# Patient Record
Sex: Male | Born: 1990 | Race: White | Hispanic: No | Marital: Single | Smoking: Never smoker
Health system: Southern US, Community
[De-identification: ages and names within clinical notes are randomized; demographics above are authoritative.]

## PROBLEM LIST (undated history)

## (undated) DIAGNOSIS — F419 Anxiety disorder, unspecified: Secondary | ICD-10-CM

## (undated) DIAGNOSIS — F840 Autistic disorder: Secondary | ICD-10-CM

---

## 2017-05-04 ENCOUNTER — Emergency Department (HOSPITAL_BASED_OUTPATIENT_CLINIC_OR_DEPARTMENT_OTHER): Payer: Self-pay

## 2017-05-04 ENCOUNTER — Other Ambulatory Visit: Payer: Self-pay

## 2017-05-04 ENCOUNTER — Encounter (HOSPITAL_BASED_OUTPATIENT_CLINIC_OR_DEPARTMENT_OTHER): Payer: Self-pay | Admitting: Emergency Medicine

## 2017-05-04 ENCOUNTER — Emergency Department (HOSPITAL_BASED_OUTPATIENT_CLINIC_OR_DEPARTMENT_OTHER)
Admission: EM | Admit: 2017-05-04 | Discharge: 2017-05-04 | Disposition: A | Discharge status: Home / Self Care | Payer: Self-pay | Attending: Emergency Medicine | Admitting: Emergency Medicine

## 2017-05-04 DIAGNOSIS — F840 Autistic disorder: Secondary | ICD-10-CM | POA: Insufficient documentation

## 2017-05-04 DIAGNOSIS — B9789 Other viral agents as the cause of diseases classified elsewhere: Secondary | ICD-10-CM | POA: Insufficient documentation

## 2017-05-04 DIAGNOSIS — J069 Acute upper respiratory infection, unspecified: Secondary | ICD-10-CM | POA: Insufficient documentation

## 2017-05-04 HISTORY — DX: Anxiety disorder, unspecified: F41.9

## 2017-05-04 HISTORY — DX: Autistic disorder: F84.0

## 2017-05-04 NOTE — ED Provider Notes (Signed)
[facility] EMERGENCY DEPARTMENT Provider Note   CSN: [id number] Arrival date & time: 05/04/17  0606     History   Chief Complaint Chief Complaint  Patient presents with  . URI    HPI Scott Munoz is a 27 y.o. male.  HPI Patient is a healthy 27 year old male who presents the emergency department with complaints of acute onset shortness of breath when waking up from a bad dream.  He now is feeling much better and believes this may have represented a panic attack/anxiety.  He denies palpitations this morning.  No shortness of breath at this time.  No history of asthma.  He has had recent URI symptoms over the past 4-5 days but several family members have had similar symptoms.  He denies shortness of breath with exertion and shortness of breath at rest at this time.  Symptoms were mild to moderate in severity.  Currently asymptomatic.   Past Medical History:  Diagnosis Date  . Anxiety   . Autism spectrum disorder     There are no active problems to display for this patient.   History reviewed. No pertinent surgical history.     Home Medications    Prior to Admission medications   Not on File    Family History No family history on file.  Social History Social History   Tobacco Use  . Smoking status: Never Smoker  . Smokeless tobacco: Never Used  Substance Use Topics  . Alcohol use: No    Frequency: Never  . Drug use: No     Allergies   Patient has no known allergies.   Review of Systems Review of Systems  All other systems reviewed and are negative.    Physical Exam Updated Vital Signs BP 102/69 (BP Location: Left Arm)   Pulse 63   Temp 97.8 F (36.6 C) (Oral)   Resp 18   Ht 6\' 2"  (1.88 m)   Wt 63.5 kg (140 lb)   SpO2 100%   BMI 17.97 kg/m   Physical Exam  Constitutional: He is oriented to person, place, and time. He appears well-developed and well-nourished.  HENT:  Head: Normocephalic and atraumatic.  Eyes: EOM are  normal.  Neck: Normal range of motion.  Cardiovascular: Normal rate, regular rhythm and normal heart sounds.  Pulmonary/Chest: Effort normal and breath sounds normal. No respiratory distress.  Abdominal: He exhibits no distension.  Musculoskeletal: Normal range of motion.  Neurological: He is alert and oriented to person, place, and time.  Skin: Skin is warm and dry.  Psychiatric: He has a normal mood and affect. Judgment normal.  Nursing note and vitals reviewed.    ED Treatments / Results  Labs (all labs ordered are listed, but only abnormal results are displayed) Labs Reviewed - No data to display  EKG  EKG Interpretation None       Radiology Dg Chest 2 View  Result Date: 05/04/2017 CLINICAL DATA:  27 year old male with cough. EXAM: CHEST  2 VIEW COMPARISON:  None. FINDINGS: The heart size and mediastinal contours are within normal limits. Both lungs are clear. The visualized skeletal structures are unremarkable. IMPRESSION: No active cardiopulmonary disease. Electronically Signed   By: Elgie CollardArash  Radparvar M.D.   On: 05/04/2017 06:41    Procedures Procedures (including critical care time)  Medications Ordered in ED Medications - No data to display   Initial Impression / Assessment and Plan / ED Course  I have reviewed the triage vital signs and the nursing notes.  Pertinent labs & imaging results that were available during my care of the patient were reviewed by me and considered in my medical decision making (see chart for details).     Suspect panic attack.  Examination without abnormality.  Chest x-ray clear.  Vital signs stable.  No hypoxia or tachycardia.  Discharged home in good condition.  Primary care follow-up.  Final Clinical Impressions(s) / ED Diagnoses   Final diagnoses:  Viral URI with cough    ED Discharge Orders    None       Azalia Bilis, MD 05/04/17 [phone]

## 2017-05-04 NOTE — ED Notes (Signed)
Patient transported to X-ray 

## 2017-05-04 NOTE — ED Notes (Signed)
Pt reports waking up with anxiety due to his dream and had difficulty breathing, tense muscles and was lightheaded. Pt had a respiratory infection last week and concerned due to labored breathing. Pt reports feeling better on assessment. Pt speaking in complete sentences during assessment.

## 2017-05-04 NOTE — ED Triage Notes (Signed)
Pt has had cough, congestion and body aches x 3 days and awoke this morning with shob.

## 2019-01-02 IMAGING — DX DG CHEST 2V
3 series · 3 of 3 positions shown · non-contrast
Comparison: None.

CLINICAL DATA: 26-year-old male with cough.

EXAM:
CHEST  2 VIEW

[chest pa]
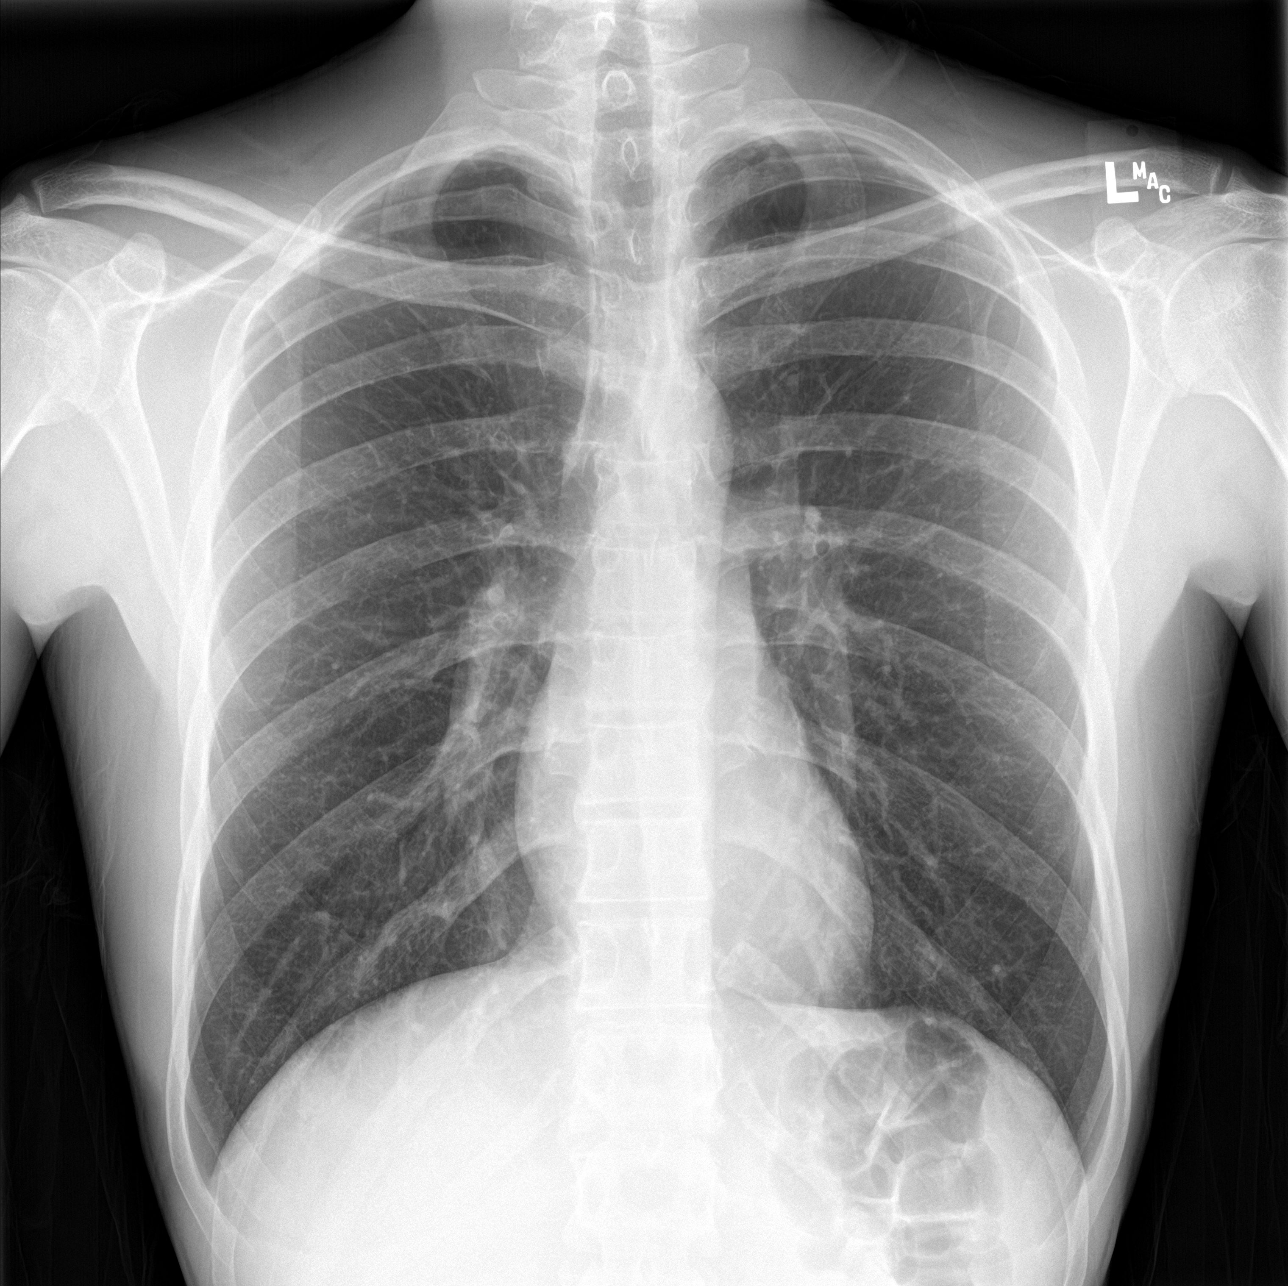

[chest lat (1 of 2)]
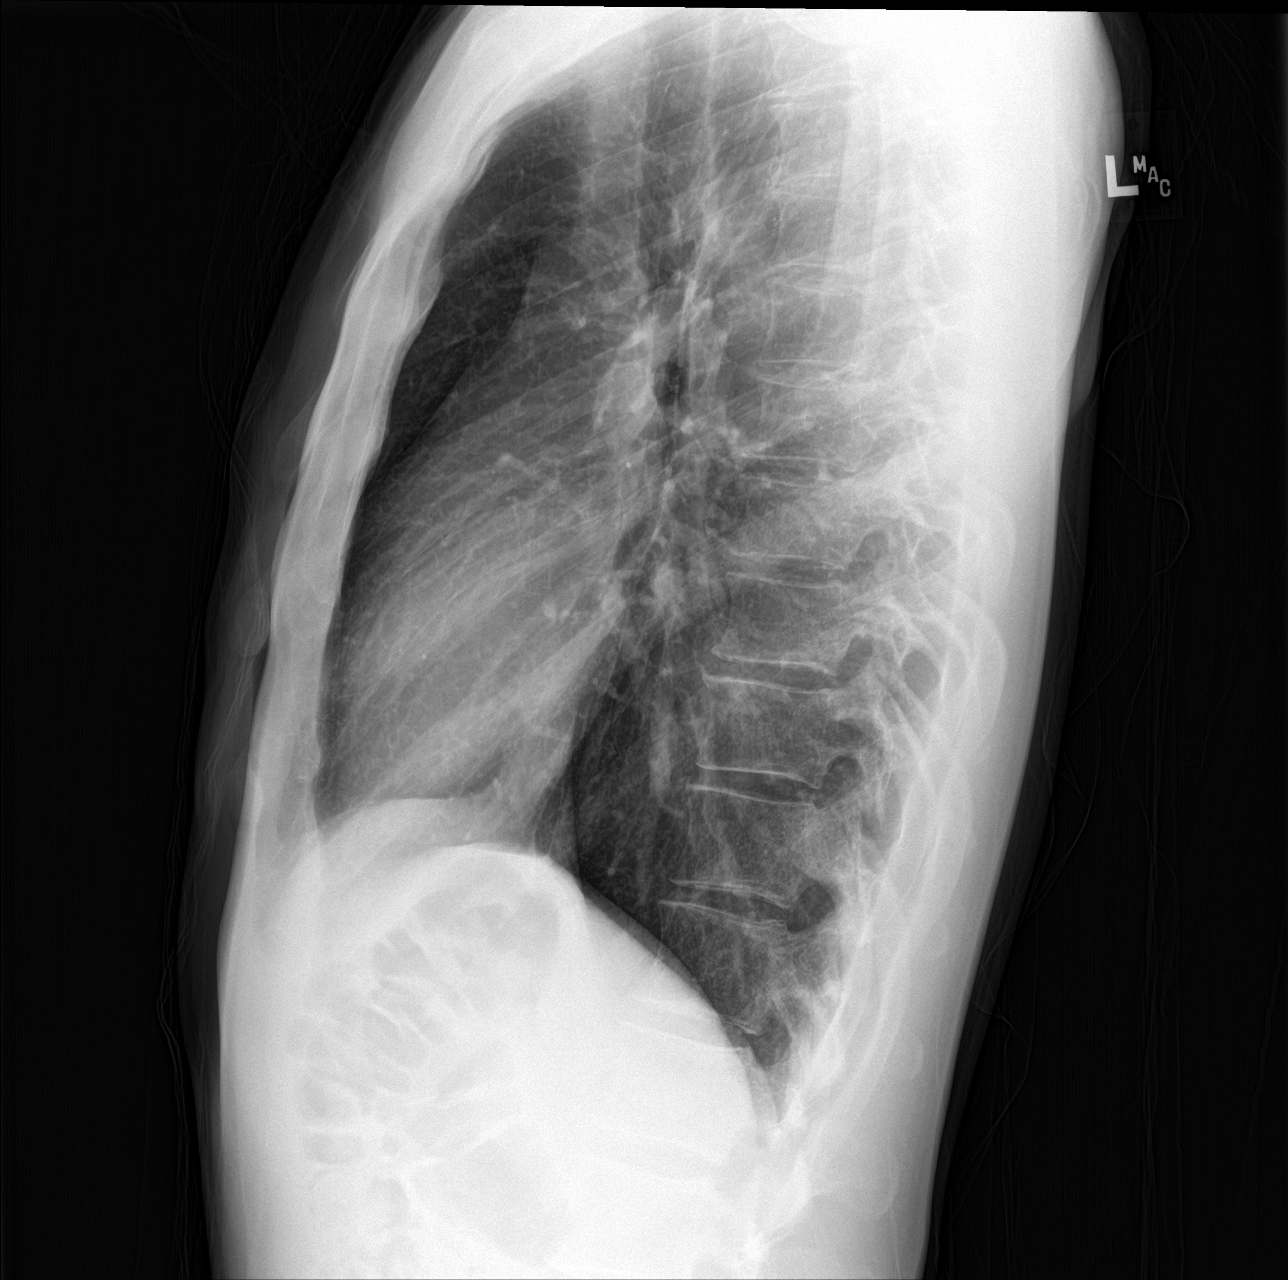

[chest lat (2 of 2)]
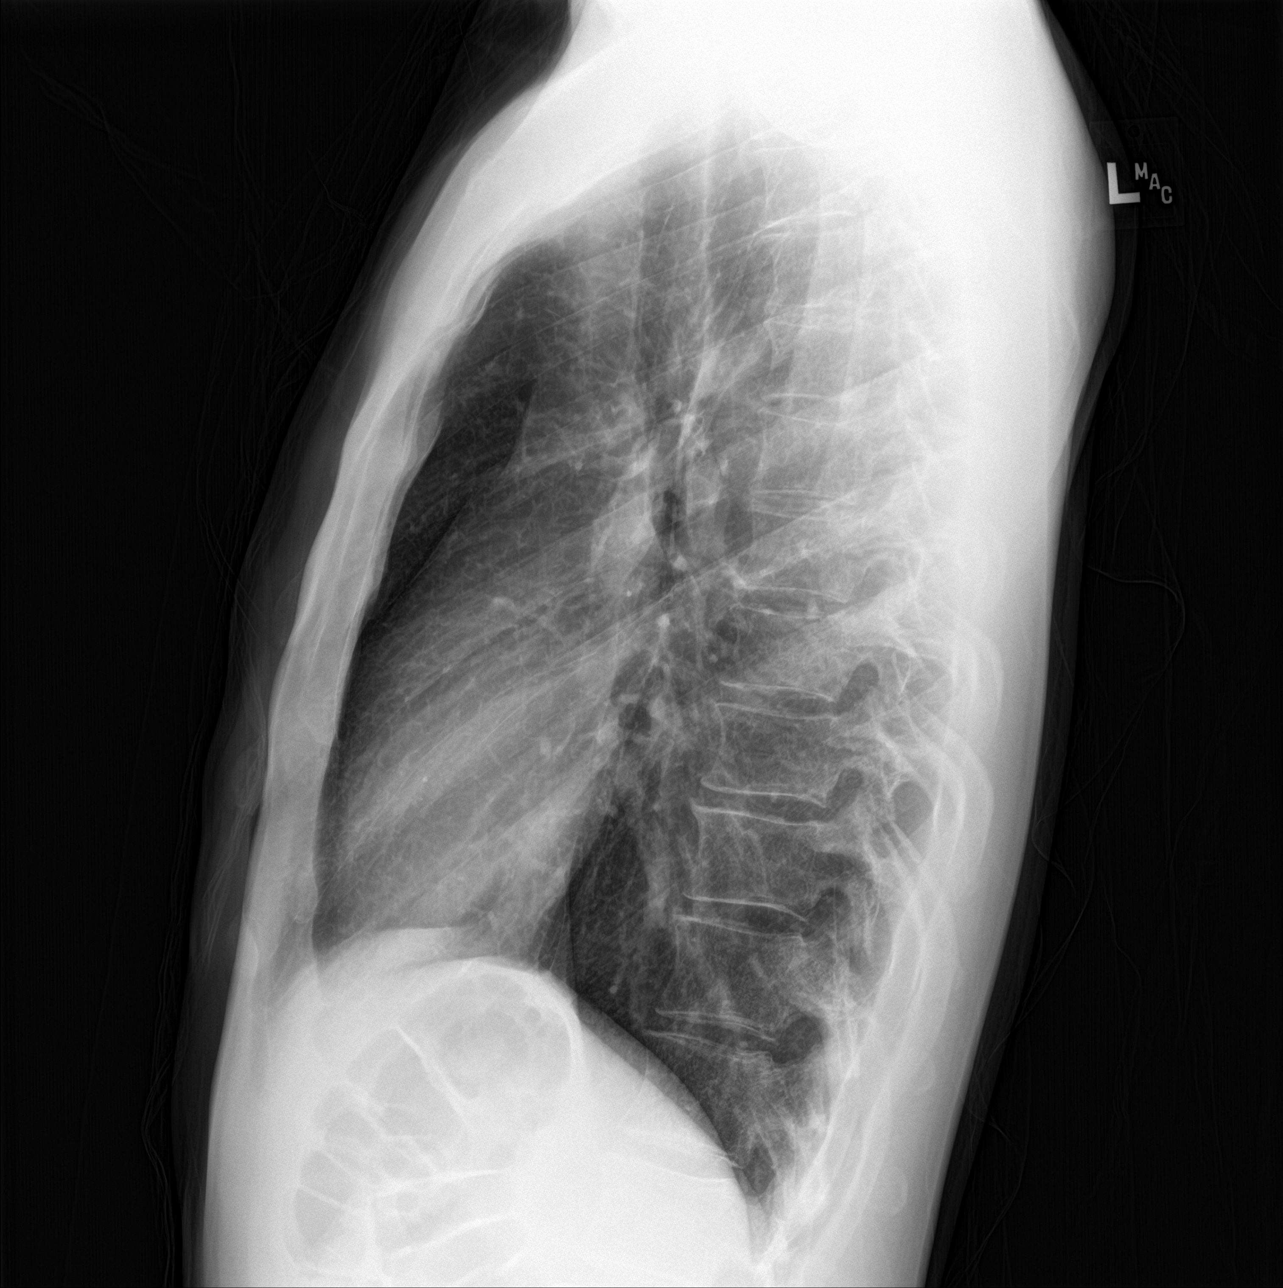

[3 of 3 positions shown; findings below may reference images not displayed]

FINDINGS: The heart size and mediastinal contours are within normal limits.
Both lungs are clear. The visualized skeletal structures are
unremarkable.
IMPRESSION: No active cardiopulmonary disease.

## 2021-08-20 ENCOUNTER — Other Ambulatory Visit: Payer: Self-pay

## 2021-08-20 ENCOUNTER — Encounter (HOSPITAL_COMMUNITY)
Admission: EM | Disposition: A | Discharge status: Home / Self Care | Payer: Self-pay | Source: Home / Self Care | Attending: Emergency Medicine

## 2021-08-20 ENCOUNTER — Emergency Department (HOSPITAL_BASED_OUTPATIENT_CLINIC_OR_DEPARTMENT_OTHER): Payer: Self-pay | Admitting: Anesthesiology

## 2021-08-20 ENCOUNTER — Emergency Department (HOSPITAL_BASED_OUTPATIENT_CLINIC_OR_DEPARTMENT_OTHER): Payer: Self-pay

## 2021-08-20 ENCOUNTER — Encounter (HOSPITAL_BASED_OUTPATIENT_CLINIC_OR_DEPARTMENT_OTHER): Payer: Self-pay | Admitting: Emergency Medicine

## 2021-08-20 ENCOUNTER — Observation Stay (HOSPITAL_BASED_OUTPATIENT_CLINIC_OR_DEPARTMENT_OTHER)
Admission: EM | Admit: 2021-08-20 | Discharge: 2021-08-21 | Disposition: A | Discharge status: Home / Self Care | Payer: Self-pay | Attending: General Surgery | Admitting: General Surgery

## 2021-08-20 ENCOUNTER — Emergency Department (HOSPITAL_COMMUNITY): Payer: Self-pay | Admitting: Anesthesiology

## 2021-08-20 DIAGNOSIS — F840 Autistic disorder: Secondary | ICD-10-CM | POA: Insufficient documentation

## 2021-08-20 DIAGNOSIS — K3580 Unspecified acute appendicitis: Secondary | ICD-10-CM

## 2021-08-20 DIAGNOSIS — K353 Acute appendicitis with localized peritonitis, without perforation or gangrene: Principal | ICD-10-CM | POA: Insufficient documentation

## 2021-08-20 HISTORY — PX: LAPAROSCOPIC APPENDECTOMY: SHX408

## 2021-08-20 LAB — CBC WITH DIFFERENTIAL/PLATELET
Abs Immature Granulocytes: 0.07 10*3/uL (ref 0.00–0.07)
Basophils Absolute: 0.1 10*3/uL (ref 0.0–0.1)
Basophils Relative: 0 %
Eosinophils Absolute: 0 10*3/uL (ref 0.0–0.5)
Eosinophils Relative: 0 %
HCT: 43.2 % (ref 39.0–52.0)
Hemoglobin: 15.2 g/dL (ref 13.0–17.0)
Immature Granulocytes: 0 %
Lymphocytes Relative: 9 %
Lymphs Abs: 1.4 10*3/uL (ref 0.7–4.0)
MCH: 31.1 pg (ref 26.0–34.0)
MCHC: 35.2 g/dL (ref 30.0–36.0)
MCV: 88.3 fL (ref 80.0–100.0)
Monocytes Absolute: 0.8 10*3/uL (ref 0.1–1.0)
Monocytes Relative: 5 %
Neutro Abs: 14 10*3/uL — ABNORMAL HIGH (ref 1.7–7.7)
Neutrophils Relative %: 86 %
Platelets: 257 10*3/uL (ref 150–400)
RBC: 4.89 MIL/uL (ref 4.22–5.81)
RDW: 12.3 % (ref 11.5–15.5)
WBC: 16.4 10*3/uL — ABNORMAL HIGH (ref 4.0–10.5)
nRBC: 0 % (ref 0.0–0.2)

## 2021-08-20 LAB — COMPREHENSIVE METABOLIC PANEL
ALT: 32 U/L (ref 0–44)
AST: 42 U/L — ABNORMAL HIGH (ref 15–41)
Albumin: 4.8 g/dL (ref 3.5–5.0)
Alkaline Phosphatase: 44 U/L (ref 38–126)
Anion gap: 17 — ABNORMAL HIGH (ref 5–15)
BUN: 18 mg/dL (ref 6–20)
CO2: 19 mmol/L — ABNORMAL LOW (ref 22–32)
Calcium: 9.6 mg/dL (ref 8.9–10.3)
Chloride: 103 mmol/L (ref 98–111)
Creatinine, Ser: 0.94 mg/dL (ref 0.61–1.24)
GFR, Estimated: >60 mL/min (ref >60)
Glucose, Bld: 119 mg/dL — ABNORMAL HIGH (ref 70–99)
Potassium: 3.5 mmol/L (ref 3.5–5.1)
Sodium: 139 mmol/L (ref 135–145)
Total Bilirubin: 1.2 mg/dL (ref 0.3–1.2)
Total Protein: 8.2 g/dL — ABNORMAL HIGH (ref 6.5–8.1)

## 2021-08-20 LAB — URINALYSIS, ROUTINE W REFLEX MICROSCOPIC
Bilirubin Urine: NEGATIVE
Glucose, UA: NEGATIVE mg/dL
Hgb urine dipstick: NEGATIVE
Ketones, ur: >=80 mg/dL — AB
Leukocytes,Ua: NEGATIVE
Nitrite: NEGATIVE
Protein, ur: NEGATIVE mg/dL
Specific Gravity, Urine: 1.015 (ref 1.005–1.030)
pH: 7.5 (ref 5.0–8.0)

## 2021-08-20 LAB — LIPASE, BLOOD: Lipase: 31 U/L (ref 11–51)

## 2021-08-20 SURGERY — APPENDECTOMY, LAPAROSCOPIC
Anesthesia: General | Site: Abdomen

## 2021-08-20 MED ORDER — SUCCINYLCHOLINE CHLORIDE 200 MG/10ML IV SOSY
PREFILLED_SYRINGE | INTRAVENOUS | Status: DC | PRN
  Administered 2021-08-20: 100 mg via INTRAVENOUS

## 2021-08-20 MED ORDER — ROCURONIUM BROMIDE 10 MG/ML (PF) SYRINGE
PREFILLED_SYRINGE | INTRAVENOUS | Status: DC | PRN
  Administered 2021-08-20: 40 mg via INTRAVENOUS

## 2021-08-20 MED ORDER — ONDANSETRON HCL 4 MG/2ML IJ SOLN
4.0000 mg | Freq: Once | INTRAMUSCULAR | Status: AC
  Administered 2021-08-20: 4 mg via INTRAVENOUS
  Filled 2021-08-20: qty 2

## 2021-08-20 MED ORDER — BUPIVACAINE-EPINEPHRINE 0.5% -1:200000 IJ SOLN
INTRAMUSCULAR | Status: DC | PRN
  Administered 2021-08-20: 15 mL

## 2021-08-20 MED ORDER — MIDAZOLAM HCL 2 MG/2ML IJ SOLN
INTRAMUSCULAR | Status: DC | PRN
  Administered 2021-08-20: 2 mg via INTRAVENOUS

## 2021-08-20 MED ORDER — SODIUM CHLORIDE 0.9 % IV SOLN: INTRAVENOUS | Status: DC | PRN

## 2021-08-20 MED ORDER — DEXAMETHASONE SODIUM PHOSPHATE 10 MG/ML IJ SOLN
INTRAMUSCULAR | Status: DC | PRN
  Administered 2021-08-20: 5 mg via INTRAVENOUS

## 2021-08-20 MED ORDER — ACETAMINOPHEN 500 MG PO TABS: 1000.0000 mg | ORAL_TABLET | Freq: Once | ORAL | Status: DC

## 2021-08-20 MED ORDER — PHENOL 1.4 % MT LIQD
1.0000 | OROMUCOSAL | Status: DC | PRN
  Filled 2021-08-20: qty 177

## 2021-08-20 MED ORDER — SENNOSIDES-DOCUSATE SODIUM 8.6-50 MG PO TABS
1.0000 | ORAL_TABLET | Freq: Every day | ORAL | Status: DC
  Administered 2021-08-20: 1 via ORAL
  Filled 2021-08-20: qty 1

## 2021-08-20 MED ORDER — ONDANSETRON 4 MG PO TBDP
4.0000 mg | ORAL_TABLET | Freq: Four times a day (QID) | ORAL | Status: DC | PRN
Start: 2021-08-20 — End: 2021-08-21
  Filled 2021-08-20: qty 1

## 2021-08-20 MED ORDER — METRONIDAZOLE 500 MG/100ML IV SOLN
500.0000 mg | Freq: Once | INTRAVENOUS | Status: DC
  Filled 2021-08-20: qty 100

## 2021-08-20 MED ORDER — OXYCODONE HCL 5 MG PO TABS: 5.0000 mg | ORAL_TABLET | Freq: Once | ORAL | Status: DC | PRN

## 2021-08-20 MED ORDER — KCL IN DEXTROSE-NACL 20-5-0.45 MEQ/L-%-% IV SOLN
INTRAVENOUS | Status: DC
  Filled 2021-08-20: qty 1000

## 2021-08-20 MED ORDER — BISACODYL 10 MG RE SUPP
10.0000 mg | Freq: Every day | RECTAL | Status: DC | PRN
Start: 2021-08-20 — End: 2021-08-21

## 2021-08-20 MED ORDER — SODIUM CHLORIDE 0.9 % IV SOLN
2.0000 g | Freq: Once | INTRAVENOUS | Status: AC
  Administered 2021-08-20: 2 g via INTRAVENOUS
  Filled 2021-08-20: qty 20

## 2021-08-20 MED ORDER — IOHEXOL 300 MG/ML  SOLN
100.0000 mL | Freq: Once | INTRAMUSCULAR | Status: AC | PRN
  Administered 2021-08-20: 100 mL via INTRAVENOUS

## 2021-08-20 MED ORDER — DROPERIDOL 2.5 MG/ML IJ SOLN: 0.6250 mg | Freq: Once | INTRAMUSCULAR | Status: DC | PRN

## 2021-08-20 MED ORDER — 0.9 % SODIUM CHLORIDE (POUR BTL) OPTIME
TOPICAL | Status: DC | PRN
  Administered 2021-08-20: 1000 mL

## 2021-08-20 MED ORDER — PROPOFOL 10 MG/ML IV BOLUS
INTRAVENOUS | Status: DC | PRN
  Administered 2021-08-20: 50 mg via INTRAVENOUS
  Administered 2021-08-20: 150 mg via INTRAVENOUS
  Administered 2021-08-20: 20 mg via INTRAVENOUS

## 2021-08-20 MED ORDER — LIDOCAINE HCL (CARDIAC) PF 100 MG/5ML IV SOSY
PREFILLED_SYRINGE | INTRAVENOUS | Status: DC | PRN
  Administered 2021-08-20: 80 mg via INTRAVENOUS

## 2021-08-20 MED ORDER — FENTANYL CITRATE PF 50 MCG/ML IJ SOSY: 25.0000 ug | PREFILLED_SYRINGE | INTRAMUSCULAR | Status: DC | PRN

## 2021-08-20 MED ORDER — ACETAMINOPHEN 10 MG/ML IV SOLN
INTRAVENOUS | Status: AC
  Filled 2021-08-20: qty 100

## 2021-08-20 MED ORDER — ACETAMINOPHEN 10 MG/ML IV SOLN
INTRAVENOUS | Status: DC | PRN
Start: 2021-08-20 — End: 2021-08-20
  Administered 2021-08-20: 1000 mg via INTRAVENOUS

## 2021-08-20 MED ORDER — ONDANSETRON HCL 4 MG/2ML IJ SOLN
INTRAMUSCULAR | Status: DC | PRN
  Administered 2021-08-20: 4 mg via INTRAVENOUS

## 2021-08-20 MED ORDER — LACTATED RINGERS IV BOLUS
1000.0000 mL | Freq: Once | INTRAVENOUS | Status: AC
  Administered 2021-08-20: 1000 mL via INTRAVENOUS

## 2021-08-20 MED ORDER — HYDROCODONE-ACETAMINOPHEN 5-325 MG PO TABS
1.0000 | ORAL_TABLET | ORAL | Status: DC | PRN
  Filled 2021-08-20 (×2): qty 1

## 2021-08-20 MED ORDER — FENTANYL CITRATE (PF) 250 MCG/5ML IJ SOLN
INTRAMUSCULAR | Status: DC | PRN
  Administered 2021-08-20: 100 ug via INTRAVENOUS
  Administered 2021-08-20 (×3): 50 ug via INTRAVENOUS

## 2021-08-20 MED ORDER — SIMETHICONE 80 MG PO CHEW: 40.0000 mg | CHEWABLE_TABLET | Freq: Four times a day (QID) | ORAL | Status: DC | PRN

## 2021-08-20 MED ORDER — LACTATED RINGERS IV SOLN: INTRAVENOUS | Status: DC | PRN

## 2021-08-20 MED ORDER — MORPHINE SULFATE (PF) 2 MG/ML IV SOLN
2.0000 mg | INTRAVENOUS | Status: DC | PRN
  Administered 2021-08-20 (×2): 2 mg via INTRAVENOUS
  Filled 2021-08-20 (×2): qty 1

## 2021-08-20 MED ORDER — OXYCODONE HCL 5 MG/5ML PO SOLN: 5.0000 mg | Freq: Once | ORAL | Status: DC | PRN

## 2021-08-20 MED ORDER — SUGAMMADEX SODIUM 200 MG/2ML IV SOLN
INTRAVENOUS | Status: DC | PRN
  Administered 2021-08-20: 150 mg via INTRAVENOUS

## 2021-08-20 MED ORDER — ENOXAPARIN SODIUM 40 MG/0.4ML IJ SOSY
40.0000 mg | PREFILLED_SYRINGE | INTRAMUSCULAR | Status: DC
  Filled 2021-08-20: qty 0.4

## 2021-08-20 MED ORDER — DICYCLOMINE HCL 10 MG PO CAPS
10.0000 mg | ORAL_CAPSULE | Freq: Once | ORAL | Status: AC
Start: 2021-08-20 — End: 2021-08-20
  Administered 2021-08-20: 10 mg via ORAL
  Filled 2021-08-20: qty 1

## 2021-08-20 MED ORDER — LACTATED RINGERS IR SOLN
Status: DC | PRN
  Administered 2021-08-20: 1000 mL

## 2021-08-20 MED ORDER — ONDANSETRON HCL 4 MG/2ML IJ SOLN: 4.0000 mg | Freq: Four times a day (QID) | INTRAMUSCULAR | Status: DC | PRN

## 2021-08-20 SURGICAL SUPPLY — 45 items
APPLIER CLIP 5 13 M/L LIGAMAX5 (MISCELLANEOUS) ×2
APPLIER CLIP ROT 10 11.4 M/L (STAPLE)
BAG COUNTER SPONGE SURGICOUNT (BAG) IMPLANT
BAG RETRIEVAL 10 (BASKET) ×1
CABLE HIGH FREQUENCY MONO STRZ (ELECTRODE) ×2 IMPLANT
CHLORAPREP W/TINT 26 (MISCELLANEOUS) ×2 IMPLANT
CLIP APPLIE 5 13 M/L LIGAMAX5 (MISCELLANEOUS) IMPLANT
CLIP APPLIE ROT 10 11.4 M/L (STAPLE) IMPLANT
DERMABOND ADVANCED (GAUZE/BANDAGES/DRESSINGS) ×1
DERMABOND ADVANCED .7 DNX12 (GAUZE/BANDAGES/DRESSINGS) ×1 IMPLANT
DRAPE LAPAROSCOPIC ABDOMINAL (DRAPES) IMPLANT
ELECT REM PT RETURN 15FT ADLT (MISCELLANEOUS) ×2 IMPLANT
GLOVE BIO SURGEON STRL SZ 6.5 (GLOVE) ×2 IMPLANT
GLOVE BIOGEL PI IND STRL 7.0 (GLOVE) ×1 IMPLANT
GLOVE BIOGEL PI INDICATOR 7.0 (GLOVE) ×1
GLOVE INDICATOR 6.5 STRL GRN (GLOVE) ×2 IMPLANT
GOWN STRL REUS W/ TWL XL LVL3 (GOWN DISPOSABLE) ×2 IMPLANT
GOWN STRL REUS W/TWL XL LVL3 (GOWN DISPOSABLE) ×2
GRASPER SUT TROCAR 14GX15 (MISCELLANEOUS) IMPLANT
HANDLE STAPLE EGIA 4 XL (STAPLE) ×1 IMPLANT
IRRIG SUCT STRYKERFLOW 2 WTIP (MISCELLANEOUS) ×2
IRRIGATION SUCT STRKRFLW 2 WTP (MISCELLANEOUS) ×1 IMPLANT
KIT BASIN OR (CUSTOM PROCEDURE TRAY) ×2 IMPLANT
KIT TURNOVER KIT A (KITS) IMPLANT
MARKER SKIN DUAL TIP RULER LAB (MISCELLANEOUS) IMPLANT
PENCIL SMOKE EVACUATOR (MISCELLANEOUS) IMPLANT
RELOAD EGIA 45 MED/THCK PURPLE (STAPLE) IMPLANT
RELOAD EGIA 45 TAN VASC (STAPLE) IMPLANT
RELOAD EGIA 60 MED/THCK PURPLE (STAPLE) ×2 IMPLANT
RELOAD EGIA 60 TAN VASC (STAPLE) ×1 IMPLANT
RELOAD STAPLE 60 MED/THCK ART (STAPLE) IMPLANT
SCISSORS LAP 5X35 DISP (ENDOMECHANICALS) ×1 IMPLANT
SLEEVE XCEL OPT CAN 5 100 (ENDOMECHANICALS) ×2 IMPLANT
SPIKE FLUID TRANSFER (MISCELLANEOUS) ×2 IMPLANT
SUT VIC AB 2-0 SH 27 (SUTURE)
SUT VIC AB 2-0 SH 27X BRD (SUTURE) IMPLANT
SUT VIC AB 4-0 PS2 27 (SUTURE) ×2 IMPLANT
SUT VICRYL 0 UR6 27IN ABS (SUTURE) ×1 IMPLANT
SYS BAG RETRIEVAL 10MM (BASKET) ×1
SYSTEM BAG RETRIEVAL 10MM (BASKET) IMPLANT
TOWEL OR 17X26 10 PK STRL BLUE (TOWEL DISPOSABLE) ×2 IMPLANT
TRAY FOLEY MTR SLVR 16FR STAT (SET/KITS/TRAYS/PACK) ×1 IMPLANT
TRAY LAPAROSCOPIC (CUSTOM PROCEDURE TRAY) ×2 IMPLANT
TROCAR BLADELESS OPT 5 100 (ENDOMECHANICALS) ×1 IMPLANT
TROCAR XCEL BLUNT TIP 100MML (ENDOMECHANICALS) ×2 IMPLANT

## 2021-08-20 NOTE — ED Notes (Signed)
Spoke to Lurena Joiner, Charity fundraiser at [employer] , aware about patient's transfer directly to PACU for surgical prep.  ?

## 2021-08-20 NOTE — Transfer of Care (Signed)
Immediate Anesthesia Transfer of Care Note ? ?Patient: Scott Munoz ? ?Procedure(s) Performed: APPENDECTOMY LAPAROSCOPIC (Abdomen) ? ?Patient Location: PACU ? ?Anesthesia Type:General ? ?Level of Consciousness: awake, drowsy and patient cooperative ? ?Airway & Oxygen Therapy: Patient Spontanous Breathing ? ?Post-op Assessment: Report given to RN and Post -op Vital signs reviewed and stable ? ?Post vital signs: Reviewed and stable ? ?Last Vitals:  ?Vitals Value Taken Time  ?BP 123/85 08/20/21 1515  ?Temp    ?Pulse 95 08/20/21 1517  ?Resp 17 08/20/21 1518  ?SpO2 100 % 08/20/21 1517  ?Vitals shown include unvalidated device data. ? ?Last Pain:  ?Vitals:  ? 08/20/21 1225  ?TempSrc: Oral  ?PainSc:   ?   ? ?  ? ?Complications: No notable events documented. ?

## 2021-08-20 NOTE — ED Triage Notes (Signed)
Pt arrives pov, slow gait, with c/o lower and RLQ pain that started in the middle of the night.  ?

## 2021-08-20 NOTE — Op Note (Signed)
Lc Joynt ?[id number] ? ? ?PRE-OPERATIVE DIAGNOSIS:  ACUTE APPENDICITIS ? ?POST-OPERATIVE DIAGNOSIS:  ACUTE APPENDICITIS ? ? Procedure(s): ?APPENDECTOMY LAPAROSCOPIC ? ?  Surgeon(s): ?Romie Levee, MD ? ?ASSISTANT: none  ? ?ANESTHESIA:   local and general ? ?EBL:   20 ml ? ?Delay start of Pharmacological VTE agent (>24hrs) due to surgical blood loss or risk of bleeding:  no ? ?DRAINS: none  ? ?SPECIMEN:  Source of Specimen:  appendix ? ?DISPOSITION OF SPECIMEN:  PATHOLOGY ? ?COUNTS:  YES ? ?PLAN OF CARE: Admit for overnight observation ? ?PATIENT DISPOSITION:  PACU - hemodynamically stable. ? ? ?INDICATIONS: ?Patient with concerning symptoms & work up suspicious for appendicitis.  Surgery was recommended: ? ?The anatomy & physiology of the digestive tract was discussed.  The pathophysiology of appendicitis was discussed.  Natural history risks without surgery was discussed.   I feel the risks of no intervention will lead to serious problems that outweigh the operative risks; therefore, I recommended diagnostic laparoscopy with removal of appendix to remove the pathology.  Laparoscopic & open techniques were discussed.   I noted a good likelihood this will help address the problem.   ? ?Risks such as bleeding, infection, abscess, leak, reoperation, possible ostomy, hernia, heart attack, death, and other risks were discussed.  Goals of post-operative recovery were discussed as well.  We will work to minimize complications.  Questions were answered.  The patient expresses understanding & wishes to proceed with surgery. ? ?OR FINDINGS: acute appendicitis  ? ?DESCRIPTION:  ? ?The patient was identified & brought into the operating room. The patient was positioned supine with left arm tucked. SCDs were active during the entire case. The patient underwent general anesthesia without any difficulty.  A foley catheter was inserted under sterile conditions. The abdomen was prepped and draped in a sterile fashion. A  Surgical Timeout confirmed our plan. ? ? I made a vertical incision through the inferior umbilical fold.  I made a nick in the infraumbilical fascia and confirmed peritoneal entry.  I placed a stay suture and then the [facility] port.  We induced carbon dioxide insufflation.  Camera inspection revealed no injury.  I placed additional ports under direct laparoscopic visualization. ? ?I mobilized the terminal ileum to proximal ascending colon in a lateral to medial fashion.  I took care to avoid injuring any retroperitoneal structures.   I freed the appendix off its attachments to the ascending colon and cecal mesentery.  I elevated the appendix.  I was able to free off the base of the appendix, which was still viable.  I stapled the appendix off the cecum using a laparoscopic blue load stapler.  I took a healthy cuff viable cecum. I skeletonized & ligated the mesoappendix with a white load stapler.   I placed the appendix inside an EndoCatch bag and removed out the [place] port. ? ?I did copious irrigation. Hemostasis was good in the mesoappendix, colon mesentery, and retroperitoneum. Staple line was intact on the cecum with no bleeding. I washed out the pelvis, retrohepatic space and right paracolic gutter.  Hemostasis is good. There was no perforation or injury.  Because the area cleaned up well after irrigation, I did not place a drain. ? ?I aspirated the carbon dioxide. I removed the ports. I closed the umbilical fascia site using a 0 Vicryl stitch. I closed skin using 4-0 vicryl stitch.  Sterile dressings were applied. ? ?Patient was extubated and sent to the recovery room.  I discussed the operative findings with  the patient's family. I suspect the patient is going used in the hospital at least overnight and will need antibiotics for 0 more days. Questions answered. They expressed understanding and appreciation.  ? ?Vanita Panda, MD ? ?Colorectal and General Surgery ?[facility]  ?

## 2021-08-20 NOTE — ED Notes (Signed)
Report called to Mia , RN at OR .  ?

## 2021-08-20 NOTE — Anesthesia Preprocedure Evaluation (Addendum)
Anesthesia Evaluation  ?Patient identified by MRN, date of birth, ID band ?Patient awake ? ? ? ?Reviewed: ?Allergy & Precautions, NPO status , Patient's Chart, lab work & pertinent test results ? ?History of Anesthesia Complications ?Negative for: history of anesthetic complications ? ?Airway ?Mallampati: I ? ?TM Distance: >3 FB ?Neck ROM: Full ? ? ? Dental ?no notable dental hx. ? ?  ?Pulmonary ?neg pulmonary ROS,  ?  ?Pulmonary exam normal ? ? ? ? ? ? ? Cardiovascular ?negative cardio ROS ?Normal cardiovascular exam ? ? ?  ?Neuro/Psych ?Anxiety ASD  ? GI/Hepatic ?negative GI ROS, Neg liver ROS, Acute appendicitis ?  ?Endo/Other  ?negative endocrine ROS ? Renal/GU ?negative Renal ROS  ?negative genitourinary ?  ?Musculoskeletal ?negative musculoskeletal ROS ?(+)  ? Abdominal ?  ?Peds ? Hematology ?negative hematology ROS ?(+)   ?Anesthesia Other Findings ?Day of surgery medications reviewed with patient. ? Reproductive/Obstetrics ?negative OB ROS ? ?  ? ? ? ? ? ? ? ? ? ? ? ? ? ?  ?  ? ? ? ? ? ? ? ?Anesthesia Physical ?Anesthesia Plan ? ?ASA: 1 and emergent ? ?Anesthesia Plan: General  ? ?Post-op Pain Management: Tylenol PO (pre-op)*  ? ?Induction: Intravenous and Rapid sequence ? ?PONV Risk Score and Plan: 3 and Treatment may vary due to age or medical condition, Ondansetron, Dexamethasone and Midazolam ? ?Airway Management Planned: Oral ETT ? ?Additional Equipment: None ? ?Intra-op Plan:  ? ?Post-operative Plan: Extubation in OR ? ?Informed Consent: I have reviewed the patients History and Physical, chart, labs and discussed the procedure including the risks, benefits and alternatives for the proposed anesthesia with the patient or authorized representative who has indicated his/her understanding and acceptance.  ? ? ? ?Dental advisory given ? ?Plan Discussed with: CRNA ? ?Anesthesia Plan Comments:   ? ? ? ? ? ?Anesthesia Quick Evaluation ? ?

## 2021-08-20 NOTE — ED Notes (Signed)
Pt refused Flagyl administration; sts he does not want 2 antibiotics going at the same time ?

## 2021-08-20 NOTE — Anesthesia Procedure Notes (Signed)
Procedure Name: Intubation ?Date/Time: 08/20/2021 2:23 PM ?Performed by: Raenette Rover, CRNA ?Pre-anesthesia Checklist: Patient identified, Emergency Drugs available, Suction available and Patient being monitored ?Patient Re-evaluated:Patient Re-evaluated prior to induction ?Oxygen Delivery Method: Circle system utilized ?Preoxygenation: Pre-oxygenation with 100% oxygen ?Induction Type: IV induction ?Ventilation: Mask ventilation without difficulty ?Laryngoscope Size: Mac and 4 ?Grade View: Grade I ?Tube type: Oral ?Tube size: 7.5 mm ?Number of attempts: 1 ?Airway Equipment and Method: Stylet ?Placement Confirmation: ETT inserted through vocal cords under direct vision, positive ETCO2 and breath sounds checked- equal and bilateral ?Secured at: 22 cm ?Tube secured with: Tape ?Dental Injury: Teeth and Oropharynx as per pre-operative assessment  ? ? ? ? ?

## 2021-08-20 NOTE — H&P (Signed)
? ? ?CC: RLQ pain ? ? ? ?HPI: ?Scott Munoz is an 30 y.o. male who is here for RLQ pain, nausea and vomiting.  This began last night.  He was seen in [facility].  CT showed acute appendicitis  ? ?Past Medical History:  ?Diagnosis Date  ? Anxiety   ? Autism spectrum disorder   ? ? ?History reviewed. No pertinent surgical history. ? ?History reviewed. No pertinent family history. ? ?Social:  reports that he has never smoked. He has never used smokeless tobacco. He reports that he does not drink alcohol and does not use drugs. ? ?Allergies: No Known Allergies ? ?Medications: I have reviewed the patient's current medications. ? ?Results for orders placed or performed during the hospital encounter of 08/20/21 (from the past 48 hour(s))  ?Lipase, blood     Status: None  ? Collection Time: 08/20/21  9:30 AM  ?Result Value Ref Range  ? Lipase 31 11 - 51 U/L  ?  Comment: Performed at [facility], [address] [place], [place] [address]  ?CBC with Differential     Status: Abnormal  ? Collection Time: 08/20/21  9:30 AM  ?Result Value Ref Range  ? WBC 16.4 (H) 4.0 - 10.5 K/uL  ? RBC 4.89 4.22 - 5.81 MIL/uL  ? Hemoglobin 15.2 13.0 - 17.0 g/dL  ? HCT 43.2 39.0 - 52.0 %  ? MCV 88.3 80.0 - 100.0 fL  ? MCH 31.1 26.0 - 34.0 pg  ? MCHC 35.2 30.0 - 36.0 g/dL  ? RDW 12.3 11.5 - 15.5 %  ? Platelets 257 150 - 400 K/uL  ? nRBC 0.0 0.0 - 0.2 %  ? Neutrophils Relative % 86 %  ? Neutro Abs 14.0 (H) 1.7 - 7.7 K/uL  ? Lymphocytes Relative 9 %  ? Lymphs Abs 1.4 0.7 - 4.0 K/uL  ? Monocytes Relative 5 %  ? Monocytes Absolute 0.8 0.1 - 1.0 K/uL  ? Eosinophils Relative 0 %  ? Eosinophils Absolute 0.0 0.0 - 0.5 K/uL  ? Basophils Relative 0 %  ? Basophils Absolute 0.1 0.0 - 0.1 K/uL  ? Immature Granulocytes 0 %  ? Abs Immature Granulocytes 0.07 0.00 - 0.07 K/uL  ?  Comment: Performed at [facility], [address] [place], [place] [address]  ?Comprehensive metabolic panel     Status: Abnormal  ? Collection Time: 08/20/21  9:30  AM  ?Result Value Ref Range  ? Sodium 139 135 - 145 mmol/L  ? Potassium 3.5 3.5 - 5.1 mmol/L  ? Chloride 103 98 - 111 mmol/L  ? CO2 19 (L) 22 - 32 mmol/L  ? Glucose, Bld 119 (H) 70 - 99 mg/dL  ?  Comment: Glucose reference range applies only to samples taken after fasting for at least 8 hours.  ? BUN 18 6 - 20 mg/dL  ? Creatinine, Ser 0.94 0.61 - 1.24 mg/dL  ? Calcium 9.6 8.9 - 10.3 mg/dL  ? Total Protein 8.2 (H) 6.5 - 8.1 g/dL  ? Albumin 4.8 3.5 - 5.0 g/dL  ? AST 42 (H) 15 - 41 U/L  ? ALT 32 0 - 44 U/L  ? Alkaline Phosphatase 44 38 - 126 U/L  ? Total Bilirubin 1.2 0.3 - 1.2 mg/dL  ? GFR, Estimated >60 >60 mL/min  ?  Comment: (NOTE) ?Calculated using the CKD-EPI Creatinine Equation (2021) ?  ? Anion gap 17 (H) 5 - 15  ?  Comment: Performed at [facility]  [address] [place], [place] [address]  ?Urinalysis, Routine w reflex microscopic     Status: Abnormal  ? Collection Time: 08/20/21 10:46 AM  ?Result Value Ref Range  ? Color, Urine YELLOW YELLOW  ? APPearance CLEAR CLEAR  ? Specific Gravity, Urine 1.015 1.005 - 1.030  ? pH 7.5 5.0 - 8.0  ? Glucose, UA NEGATIVE NEGATIVE mg/dL  ? Hgb urine dipstick NEGATIVE NEGATIVE  ? Bilirubin Urine NEGATIVE NEGATIVE  ? Ketones, ur >=80 (A) NEGATIVE mg/dL  ? Protein, ur NEGATIVE NEGATIVE mg/dL  ? Nitrite NEGATIVE NEGATIVE  ? Leukocytes,Ua NEGATIVE NEGATIVE  ?  Comment: Microscopic not done on urines with negative protein, blood, leukocytes, nitrite, or glucose < 500 mg/dL. ?Performed at [facility], [address] [place], [place] [address] ?  ? ? ?CT ABDOMEN PELVIS W CONTRAST ? ?Result Date: 08/20/2021 ?CLINICAL DATA:  Acute onset right lower quadrant pain last night. EXAM: CT ABDOMEN AND PELVIS WITH CONTRAST TECHNIQUE: Multidetector CT imaging of the abdomen and pelvis was performed using the standard protocol following bolus administration of intravenous contrast. RADIATION DOSE REDUCTION: This exam was performed according to the departmental  dose-optimization program which includes automated exposure control, adjustment of the mA and/or kV according to patient size and/or use of iterative reconstruction technique. CONTRAST:  139mL OMNIPAQUE IOHEXOL 300 MG/ML  SOLN COMPARISON:  None Available. FINDINGS: Lower Chest: No acute findings. Hepatobiliary: No hepatic masses identified. Gallbladder is unremarkable. No evidence of biliary ductal dilatation. Pancreas:  No mass or inflammatory changes. Spleen: Within normal limits in size and appearance. Adrenals/Urinary Tract: No masses identified. No evidence of ureteral calculi or hydronephrosis. Stomach/Bowel: Acute appendicitis mild periappendiceal inflammatory changes seen, with following details: Appendix: Location- Standard Diameter-11 mm Appendicolith- Present Mucosal hyper-enhancement- Present Extraluminal Gas- Absent Periappendiceal Collection- None, but tiny amount of free fluid noted in pelvic cul-de-sac. Vascular/Lymphatic: No pathologically enlarged lymph nodes. No acute vascular findings. Reproductive:  No mass or other significant abnormality. Other:  None. Musculoskeletal:  No suspicious bone lesions identified. IMPRESSION: Positive for acute appendicitis. No evidence of abscess or other complication. Electronically Signed   By: Marlaine Hind M.D.   On: 08/20/2021 10:48   ? ?ROS - all of the below systems have been reviewed with the patient and positives are indicated with bold text ?General: chills, fever or night sweats ?Eyes: blurry vision or double vision ?ENT: epistaxis or sore throat ?Hematologic/Lymphatic: bleeding problems, blood clots or swollen lymph nodes ?Endocrine: temperature intolerance or unexpected weight changes ?Breast: new or changing breast lumps or nipple discharge ?Resp: cough, shortness of breath, or wheezing ?CV: chest pain or dyspnea on exertion ?GI: as per HPI ?GU: dysuria, trouble voiding, or hematuria ?Neuro: TIA or stroke symptoms ? ? ? ?PE ?Blood pressure 135/74,  pulse 67, temperature 98.9 ?F (37.2 ?C), temperature source Oral, resp. rate 20, height 6\' 2"  (1.88 m), weight 65.8 kg, SpO2 100 %. ?Constitutional: NAD; conversant; no deformities ?Eyes: Moist conjunctiva; no lid lag; anicteric; PERRL ?Neck: Trachea midline; no thyromegaly ?Lungs: Normal respiratory effort ?CV: RRR ?GI: Abd soft, TTP RLQ ?MSK: Normal range of motion of extremities; no clubbing/cyanosis ?Psychiatric: Appropriate affect; alert and oriented x3 ? ?Results for orders placed or performed during the hospital encounter of 08/20/21 (from the past 48 hour(s))  ?Lipase, blood     Status: None  ? Collection Time: 08/20/21  9:30 AM  ?Result Value Ref Range  ? Lipase 31 11 - 51 U/L  ?  Comment:  Performed at [facility], [address], [place], [place] [address]  ?CBC with Differential     Status: Abnormal  ? Collection Time: 08/20/21  9:30 AM  ?Result Value Ref Range  ? WBC 16.4 (H) 4.0 - 10.5 K/uL  ? RBC 4.89 4.22 - 5.81 MIL/uL  ? Hemoglobin 15.2 13.0 - 17.0 g/dL  ? HCT 43.2 39.0 - 52.0 %  ? MCV 88.3 80.0 - 100.0 fL  ? MCH 31.1 26.0 - 34.0 pg  ? MCHC 35.2 30.0 - 36.0 g/dL  ? RDW 12.3 11.5 - 15.5 %  ? Platelets 257 150 - 400 K/uL  ? nRBC 0.0 0.0 - 0.2 %  ? Neutrophils Relative % 86 %  ? Neutro Abs 14.0 (H) 1.7 - 7.7 K/uL  ? Lymphocytes Relative 9 %  ? Lymphs Abs 1.4 0.7 - 4.0 K/uL  ? Monocytes Relative 5 %  ? Monocytes Absolute 0.8 0.1 - 1.0 K/uL  ? Eosinophils Relative 0 %  ? Eosinophils Absolute 0.0 0.0 - 0.5 K/uL  ? Basophils Relative 0 %  ? Basophils Absolute 0.1 0.0 - 0.1 K/uL  ? Immature Granulocytes 0 %  ? Abs Immature Granulocytes 0.07 0.00 - 0.07 K/uL  ?  Comment: Performed at [facility], [address] [place], [place] [address]  ?Comprehensive metabolic panel     Status: Abnormal  ? Collection Time: 08/20/21  9:30 AM  ?Result Value Ref Range  ? Sodium 139 135 - 145 mmol/L  ? Potassium 3.5 3.5 - 5.1 mmol/L  ? Chloride 103 98 - 111 mmol/L  ? CO2 19 (L) 22 - 32 mmol/L  ?  Glucose, Bld 119 (H) 70 - 99 mg/dL  ?  Comment: Glucose reference range applies only to samples taken after fasting for at least 8 hours.  ? BUN 18 6 - 20 mg/dL  ? Creatinine, Ser 0.94 0.61 - 1.24 mg/dL  ? Calcium 9.6

## 2021-08-20 NOTE — ED Provider Notes (Addendum)
?[facility] EMERGENCY DEPARTMENT ?Provider Note ? ? ?CSN: [id number] ?Arrival date & time: 08/20/21  [id number] ? ?  ? ?History ?Chief Complaint  ?Patient presents with  ? Abdominal Pain  ? ? ?Chet Uchida is a 31 y.o. male history of anxiety and autism spectrum disorder presents to the emergency department for evaluation of periumbilical/right lower quadrant pain since 0100 today.  He denies any nausea, vomiting, diarrhea, constipation, dysuria, hematuria, fevers, chest pain, or shortness of breath.  Denies any testicular, penile, or scrotal swelling or pain.  Reports he has been belching.  Denies any medical or surgical history.  No daily medications.  No known drug allergies.  Denies any tobacco, EtOH, illicit drug use ever. ? ? ?Abdominal Pain ?Associated symptoms: no chest pain, no chills, no constipation, no diarrhea, no dysuria, no fever, no hematuria, no nausea, no shortness of breath and no vomiting   ? ?  ? ?Home Medications ?Prior to Admission medications   ?Not on File  ?   ? ?Allergies    ?Patient has no known allergies.   ? ?Review of Systems   ?Review of Systems  ?Constitutional:  Negative for chills and fever.  ?Respiratory:  Negative for shortness of breath.   ?Cardiovascular:  Negative for chest pain.  ?Gastrointestinal:  Positive for abdominal pain. Negative for blood in stool, constipation, diarrhea, nausea and vomiting.  ?Genitourinary:  Negative for dysuria, frequency, hematuria, penile discharge, penile pain, penile swelling, scrotal swelling and testicular pain.  ?Musculoskeletal:  Negative for back pain.  ? ?Physical Exam ?Updated Vital Signs ?BP 133/78   Pulse (!) 47   Temp 97.7 ?F (36.5 ?C)   Resp 20   Ht 6\' 2"  (1.88 m)   Wt 65.8 kg   SpO2 100%   BMI 18.62 kg/m?  ?Physical Exam ?Vitals and nursing note reviewed.  ?Constitutional:   ?   General: He is not in acute distress. ?   Appearance: Normal appearance. He is not ill-appearing or toxic-appearing.  ?HENT:  ?   Head:  Normocephalic and atraumatic.  ?   Mouth/Throat:  ?   Mouth: Mucous membranes are moist.  ?Eyes:  ?   General: No scleral icterus. ?Cardiovascular:  ?   Rate and Rhythm: Regular rhythm. Bradycardia present.  ?Pulmonary:  ?   Effort: Pulmonary effort is normal. No respiratory distress.  ?   Breath sounds: Normal breath sounds.  ?Abdominal:  ?   General: Abdomen is flat. Bowel sounds are normal.  ?   Palpations: Abdomen is soft.  ?   Tenderness: There is abdominal tenderness in the right lower quadrant and periumbilical area. There is no right CVA tenderness, left CVA tenderness, guarding or rebound. Negative signs include Rovsing's sign, psoas sign and obturator sign.  ?   Comments: Abdomen soft with normal active bowel sounds.  No overlying skin changes noted.  Tenderness to the periumbilical and right lower quadrant.  Negative Rovsing's, obturator, and psoas sign.  ?Musculoskeletal:     ?   General: No deformity.  ?   Cervical back: Normal range of motion.  ?Skin: ?   General: Skin is warm and dry.  ?Neurological:  ?   General: No focal deficit present.  ?   Mental Status: He is alert. Mental status is at baseline.  ? ? ?ED Results / Procedures / Treatments   ?Labs ?(all labs ordered are listed, but only abnormal results are displayed) ?Labs Reviewed  ?CBC WITH DIFFERENTIAL/PLATELET - Abnormal; Notable for the following  components:  ?    Result Value  ? WBC 16.4 (*)   ? Neutro Abs 14.0 (*)   ? All other components within normal limits  ?COMPREHENSIVE METABOLIC PANEL - Abnormal; Notable for the following components:  ? CO2 19 (*)   ? Glucose, Bld 119 (*)   ? Total Protein 8.2 (*)   ? AST 42 (*)   ? Anion gap 17 (*)   ? All other components within normal limits  ?URINALYSIS, ROUTINE W REFLEX MICROSCOPIC - Abnormal; Notable for the following components:  ? Ketones, ur >=80 (*)   ? All other components within normal limits  ?LIPASE, BLOOD  ? ? ?EKG ?None ? ?Radiology ?CT ABDOMEN PELVIS W CONTRAST ? ?Result Date:  08/20/2021 ?CLINICAL DATA:  Acute onset right lower quadrant pain last night. EXAM: CT ABDOMEN AND PELVIS WITH CONTRAST TECHNIQUE: Multidetector CT imaging of the abdomen and pelvis was performed using the standard protocol following bolus administration of intravenous contrast. RADIATION DOSE REDUCTION: This exam was performed according to the departmental dose-optimization program which includes automated exposure control, adjustment of the mA and/or kV according to patient size and/or use of iterative reconstruction technique. CONTRAST:  162mL OMNIPAQUE IOHEXOL 300 MG/ML  SOLN COMPARISON:  None Available. FINDINGS: Lower Chest: No acute findings. Hepatobiliary: No hepatic masses identified. Gallbladder is unremarkable. No evidence of biliary ductal dilatation. Pancreas:  No mass or inflammatory changes. Spleen: Within normal limits in size and appearance. Adrenals/Urinary Tract: No masses identified. No evidence of ureteral calculi or hydronephrosis. Stomach/Bowel: Acute appendicitis mild periappendiceal inflammatory changes seen, with following details: Appendix: Location- Standard Diameter-11 mm Appendicolith- Present Mucosal hyper-enhancement- Present Extraluminal Gas- Absent Periappendiceal Collection- None, but tiny amount of free fluid noted in pelvic cul-de-sac. Vascular/Lymphatic: No pathologically enlarged lymph nodes. No acute vascular findings. Reproductive:  No mass or other significant abnormality. Other:  None. Musculoskeletal:  No suspicious bone lesions identified. IMPRESSION: Positive for acute appendicitis. No evidence of abscess or other complication. Electronically Signed   By: Marlaine Hind M.D.   On: 08/20/2021 10:48   ? ?Procedures ?Procedures  ? ?Medications Ordered in ED ?Medications  ?cefTRIAXone (ROCEPHIN) 2 g in sodium chloride 0.9 % 100 mL IVPB (2 g Intravenous New Bag/Given 08/20/21 1144)  ?  And  ?metroNIDAZOLE (FLAGYL) IVPB 500 mg (has no administration in time range)  ?0.9 %  sodium  chloride infusion ( Intravenous New Bag/Given 08/20/21 1143)  ?ondansetron (ZOFRAN) injection 4 mg (4 mg Intravenous Given 08/20/21 1008)  ?lactated ringers bolus 1,000 mL ( Intravenous Stopped 08/20/21 1127)  ?dicyclomine (BENTYL) capsule 10 mg (10 mg Oral Given 08/20/21 1008)  ?iohexol (OMNIPAQUE) 300 MG/ML solution 100 mL (100 mLs Intravenous Contrast Given 08/20/21 1020)  ? ? ?ED Course/ Medical Decision Making/ A&P ?  ?                        ?Medical Decision Making ?Amount and/or Complexity of Data Reviewed ?Labs: ordered. ?Radiology: ordered. ? ?Risk ?Prescription drug management. ?Decision regarding hospitalization. ? ? ?31 year old male presents emergency department for evaluation of periumbilical and right lower quadrant pain since 0100 today.  On arrival, the patient had a vomiting episode that was not bloody and nonbilious.  His vital signs show bradycardia otherwise normal.  Normotensive, afebrile, normal respiratory rate satting well on room air without any increased work of breathing.  Physical exam is pertinent for Abdomen soft with normal active bowel sounds.  No overlying skin changes noted.  Mild tenderness to the periumbilical and right lower quadrant.  Negative Rovsing's, obturator, and psoas sign.  Patient is overall well-appearing. ? ?He declines any pain medication, but is willing for some Bentyl PO only.  Ordered Zofran and 1L of LR. ? ?Basic lab work ordered.  CBC shows significant leukocytosis at 16.4 with a left shift.  Normal lipase.  CMP shows decrease in CO2 at 19 with an elevated gap at 17.  Glucose elevated at 119.  Total protein elevated 8.2.  AST mildly elevated 42.  Urinalysis shows greater than 80 ketones, otherwise normal.  CT abdomen is positive for acute appendicitis without perforation. ? ?With the positive acute appendicitis read on CT, ED antibiotics were ordered for Rocephin and Flagyl. ? ?The patient initially had a respiratory rate of 24 and with his white count of 16 does meet  two criteria for SIRs. However, the patient is no acute appearing and his respiratory rate improved. He is not septic appearing.  ? ?Consult was placed to general surgery.  Dr. Marcello Moores spoke to my attending, Dr. Kathrynn Humble and s

## 2021-08-21 MED ORDER — ACETAMINOPHEN 325 MG PO TABS
650.0000 mg | ORAL_TABLET | Freq: Once | ORAL | Status: AC
  Administered 2021-08-21: 650 mg via ORAL
  Filled 2021-08-21: qty 2

## 2021-08-21 MED ORDER — TRAMADOL HCL 50 MG PO TABS
50.0000 mg | ORAL_TABLET | Freq: Four times a day (QID) | ORAL | 0 refills | Status: AC | PRN
Start: 2021-08-21 — End: ?

## 2021-08-21 NOTE — Discharge Summary (Signed)
Physician Discharge Summary  ?Patient ID: ?Scott Munoz ?MRN: [id number] ?DOB/AGE: 33-Nov-1992 33 y.o. ? ?Admit date: 08/20/2021 ?Discharge date: 08/21/2021 ? ?Admission Diagnoses: ?Acute appendicitis ? ?Discharge Diagnoses:  ?Principal Problem: ?  Acute appendicitis ? ? ?Discharged Condition: good ? ?Hospital Course: Patient presented to [facility] with acute appendicitis.  He was transferred to the operating room and laparoscopic appendectomy was performed.  He was monitored overnight and deemed to be in stable condition.  He was discharged home when tolerating a diet and pain controlled with oral medications. ? ?Consults: None ? ?Significant Diagnostic Studies: labs: cbc, bmet ? ?Treatments: IV hydration, antibiotics: ceftriaxone, and surgery: lap appendectomy ? ?Discharge Exam: ?Blood pressure (!) 121/55, pulse (!) 52, temperature 98.9 ?F (37.2 ?C), temperature source Oral, resp. rate 16, height 6\' 2"  (1.88 m), weight 65.8 kg, SpO2 99 %. ?General appearance: alert and cooperative ?GI: soft, non-distended ?Incision/Wound: clean, dry ? ?Disposition: home ? ? ?Allergies as of 08/21/2021   ?No Known Allergies ?  ? ?  ?Medication List  ?  ? ?TAKE these medications   ? ?traMADol 50 MG tablet ?Commonly known as: ULTRAM ?Take 1-2 tablets (50-100 mg total) by mouth every 6 (six) hours as needed. ?  ? ?  ? ? Follow-up Information   ? ? [facility], [place]. Schedule an appointment as soon as possible for a visit in 2 week(s).   ?Specialty: General Surgery ?Contact information: ?[address] ?[address] ?[place] ?[phone] ? ?  ?  ? ?  ?  ? ?  ? ? ?Signed: ?Rosario Adie ?08/21/2021, 8:26 AM ? ? ?

## 2021-08-21 NOTE — Progress Notes (Signed)
The patient is alert and oriented and has been seen by his physician. Patient ambulated around the unit three times this morning. Patient given Tylenol as requested prior to discharge. Patient tolerated regular diet well and ate 100% of breakfast. The orders for discharge were written. IV has been removed. Went over discharge instructions with patient and family. He is being walked down to his ride for discharge with all of his belongings.   ?

## 2021-08-21 NOTE — Plan of Care (Signed)

## 2021-08-21 NOTE — Clinical Social Work Note (Signed)
?  Transition of Care (TOC) Screening Note ? ? ?Patient Details  ?Name: Scott Munoz ?Date of Birth: 07-08-1990 ? ? ?Transition of Care (TOC) CM/SW Contact:    ?Darleene Cleaver, LCSW ?Phone Number: ?08/21/2021, 9:38 AM ? ? ? ?Transition of Care Department [facility]) has reviewed patient and no TOC needs have been identified at this time. We will continue to monitor patient advancement through interdisciplinary progression rounds. If new patient transition needs arise, please place a TOC consult. ?  ?

## 2021-08-22 ENCOUNTER — Encounter (HOSPITAL_COMMUNITY): Payer: Self-pay | Admitting: General Surgery

## 2021-08-22 NOTE — Anesthesia Postprocedure Evaluation (Signed)
Anesthesia Post Note ? ?Patient: Scott Munoz ? ?Procedure(s) Performed: APPENDECTOMY LAPAROSCOPIC (Abdomen) ? ?  ? ?Patient location during evaluation: PACU ?Anesthesia Type: General ?Level of consciousness: awake and alert ?Pain management: pain level controlled ?Vital Signs Assessment: post-procedure vital signs reviewed and stable ?Respiratory status: spontaneous breathing, nonlabored ventilation and respiratory function stable ?Cardiovascular status: blood pressure returned to baseline ?Postop Assessment: no apparent nausea or vomiting ?Anesthetic complications: no ? ? ?No notable events documented. ? ? ?Shanda Howells ? ? ? ? ?

## 2021-08-23 LAB — SURGICAL PATHOLOGY

## 2023-04-20 IMAGING — CT CT ABD-PELV W/ CM
2 of 4 series · 16 of 46 positions shown, 18 images · IV contrast (Omnipaque)
Comparison: None Available.

CLINICAL DATA: Acute onset right lower quadrant pain last night.

EXAM:
CT ABDOMEN AND PELVIS WITH CONTRAST
TECHNIQUE: Multidetector CT imaging of the abdomen and pelvis was performed
using the standard protocol following bolus administration of
intravenous contrast.

[Series 2: axial st · axial · 0.82mm/px · z∈[-533,-113]mm · 13 of 94 slices shown, 15 images]
[im 5/94  soft-tissue]
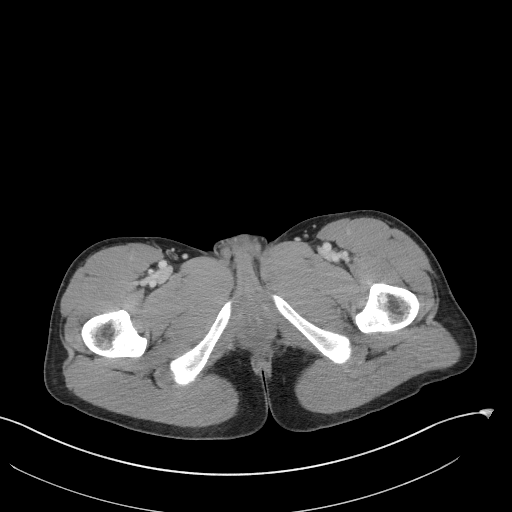
[im 5/94  bone]
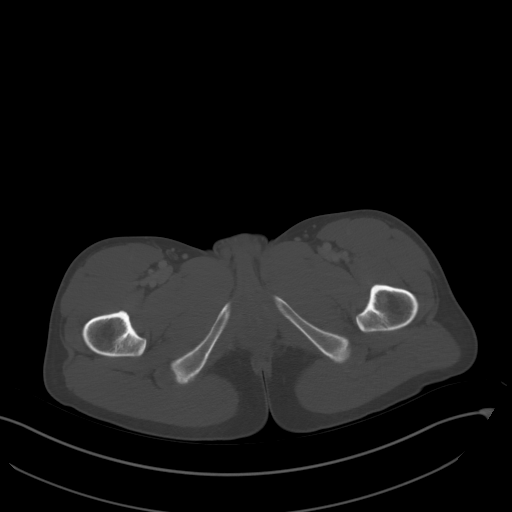
[im 13/94  soft-tissue]
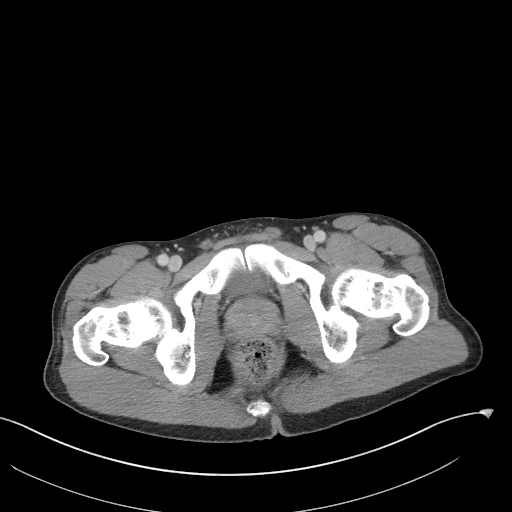
[im 21/94  soft-tissue]
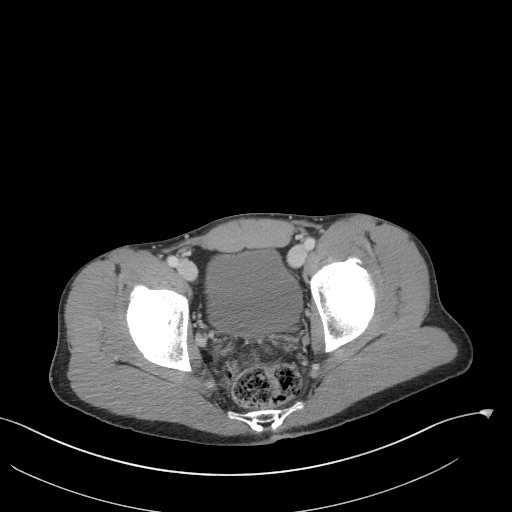
[im 25/94  soft-tissue]
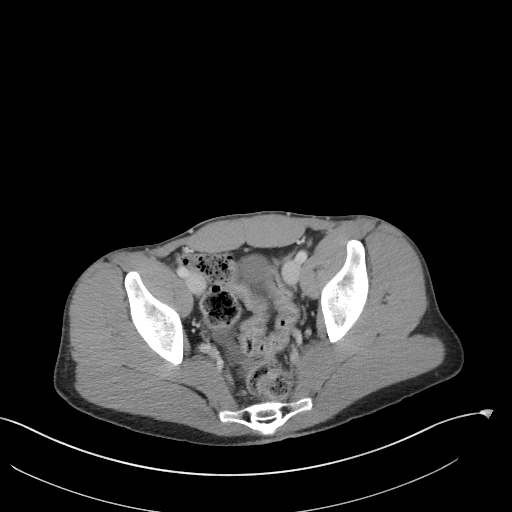
[im 33/94  soft-tissue]
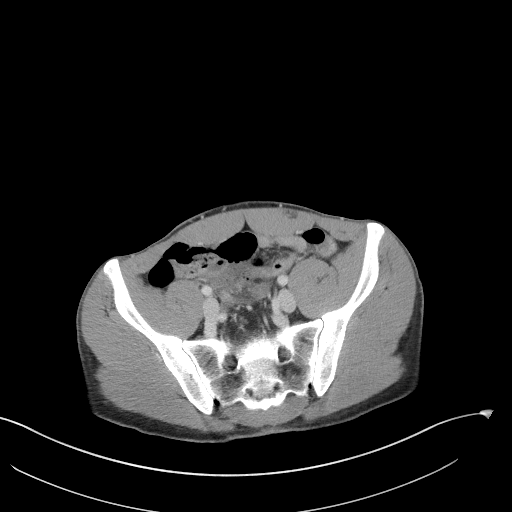
[im 41/94  soft-tissue]
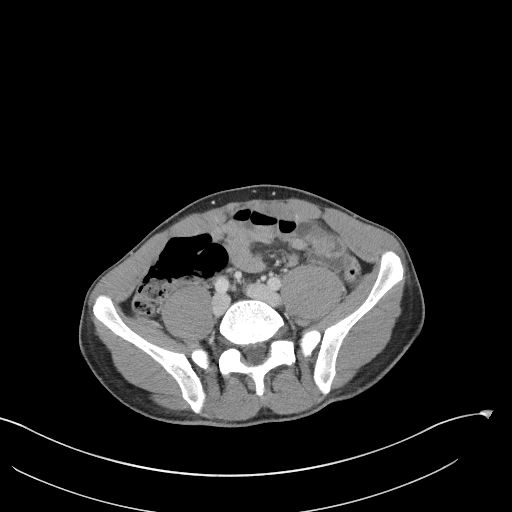
[im 49/94  soft-tissue]
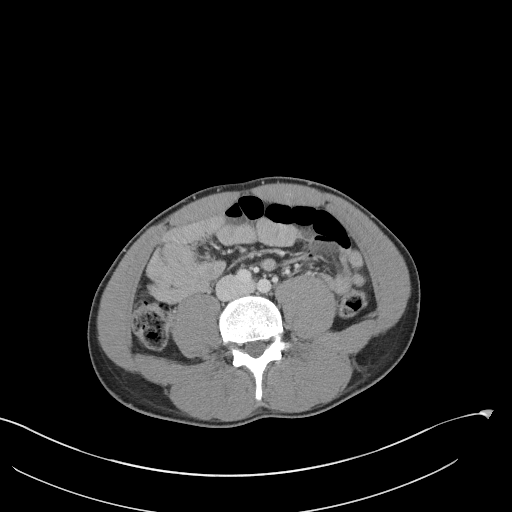
[im 53/94  soft-tissue]
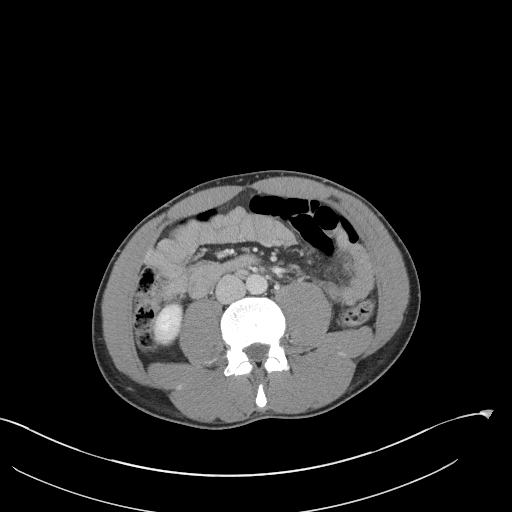
[im 61/94  soft-tissue]
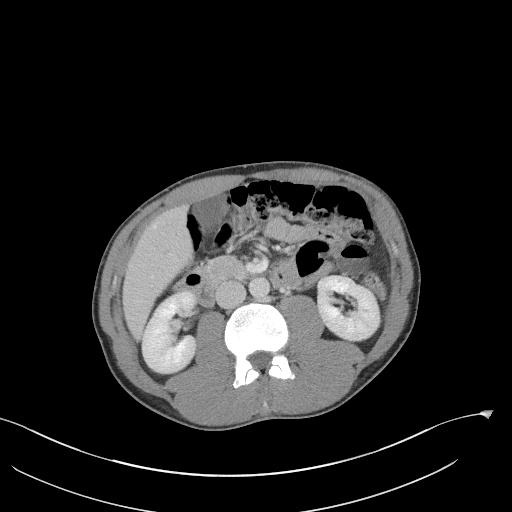
[im 61/94  bone]
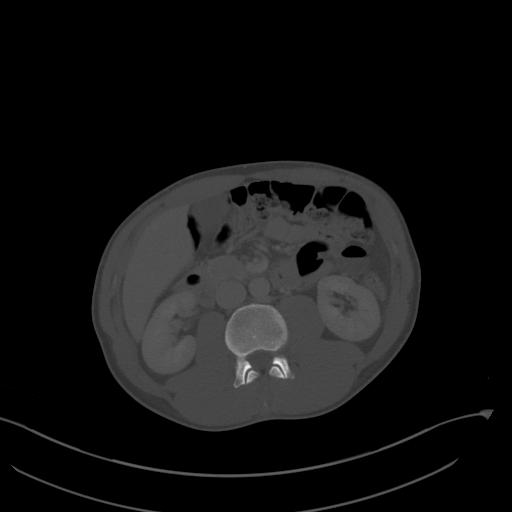
[im 69/94  soft-tissue]
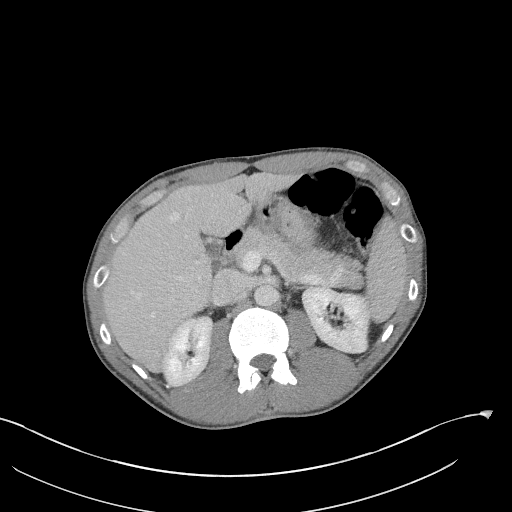
[im 73/94  soft-tissue]
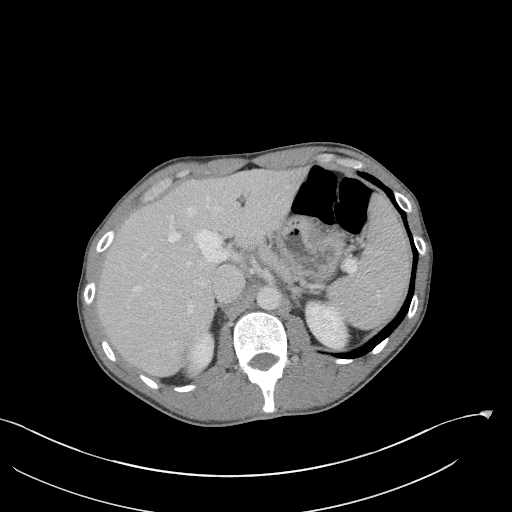
[im 81/94  soft-tissue]
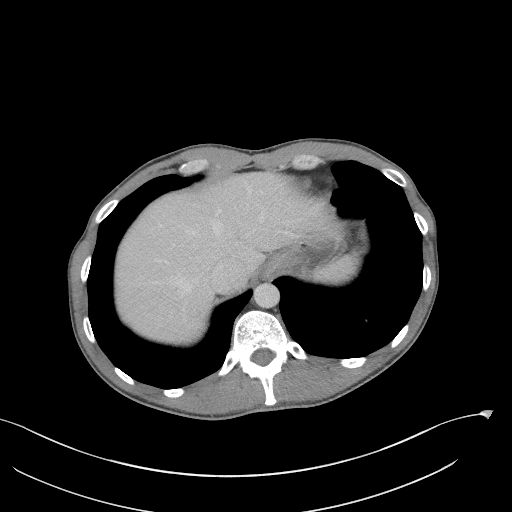
[im 89/94  soft-tissue]
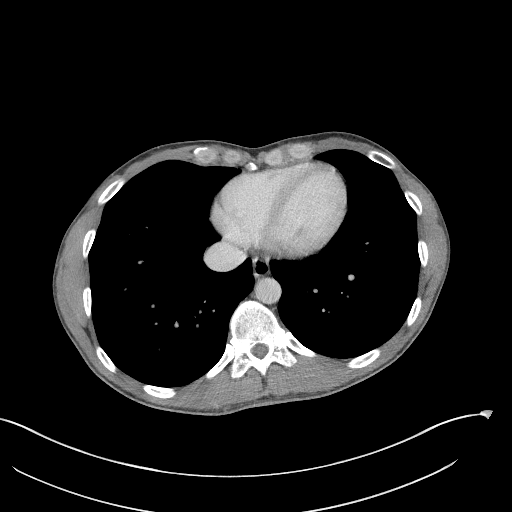

[Series 5: coronal st · coronal · 0.81mm/px · 3 of 101 slices shown]
[im 34/101  soft-tissue]
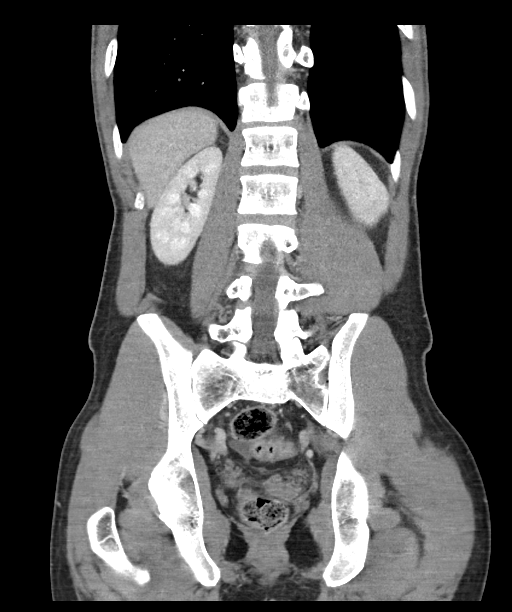
[im 45/101  soft-tissue]
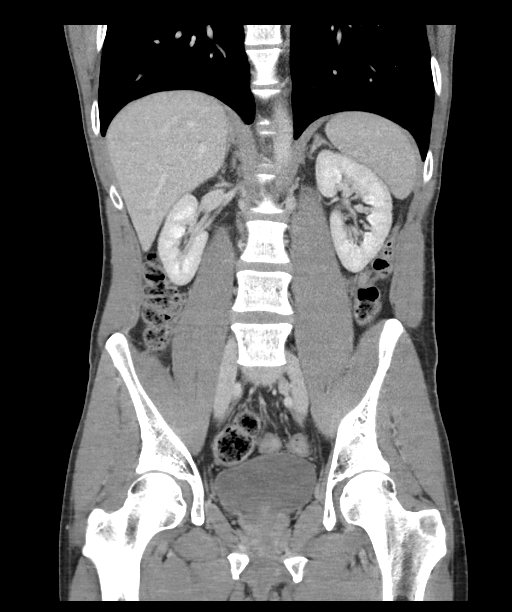
[im 56/101  soft-tissue]
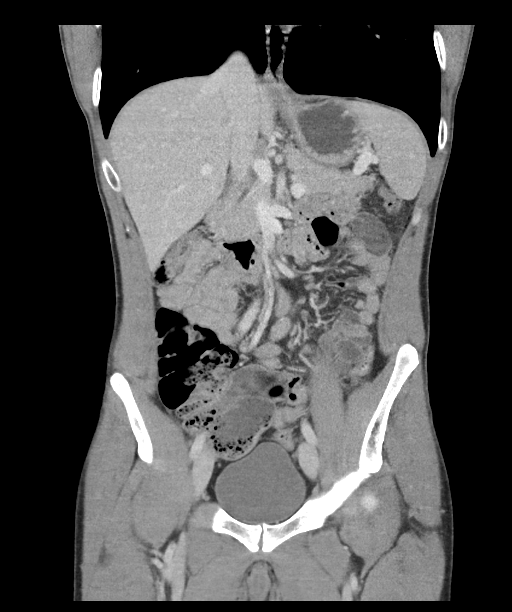

[16 of 46 positions shown; findings below may reference images not displayed]

RADIATION DOSE REDUCTION: This exam was performed according to the
departmental dose-optimization program which includes automated
exposure control, adjustment of the mA and/or kV according to
patient size and/or use of iterative reconstruction technique.

CONTRAST:  100mL OMNIPAQUE IOHEXOL 300 MG/ML  SOLN
FINDINGS: Lower Chest: No acute findings.

Hepatobiliary: No hepatic masses identified. Gallbladder is
unremarkable. No evidence of biliary ductal dilatation.

Pancreas:  No mass or inflammatory changes.

Spleen: Within normal limits in size and appearance.

Adrenals/Urinary Tract: No masses identified. No evidence of
ureteral calculi or hydronephrosis.

Stomach/Bowel: Acute appendicitis mild periappendiceal inflammatory
changes seen, with following details:

Appendix: Location- Standard

Ciameter-QQ mm

Appendicolith- Present

Mucosal hyper-enhancement- Present

Extraluminal Gas- Absent

Periappendiceal Collection- None, but tiny amount of free fluid
noted in pelvic cul-de-sac.

Vascular/Lymphatic: No pathologically enlarged lymph nodes. No acute
vascular findings.

Reproductive:  No mass or other significant abnormality.

Other:  None.

Musculoskeletal:  No suspicious bone lesions identified.
IMPRESSION: Positive for acute appendicitis.

No evidence of abscess or other complication.
